# Patient Record
Sex: Male | Born: 1970 | Race: Black or African American | Hispanic: No | Marital: Single | Smoking: Never smoker
Health system: Southern US, Community
[De-identification: ages and names within clinical notes are randomized; demographics above are authoritative.]

---

## 2003-09-02 ENCOUNTER — Emergency Department (HOSPITAL_COMMUNITY): Admission: EM | Admit: 2003-09-02 | Discharge: 2003-09-03 | Payer: Self-pay | Admitting: Emergency Medicine

## 2010-07-01 ENCOUNTER — Encounter: Admission: RE | Admit: 2010-07-01 | Discharge: 2010-07-01 | Payer: Self-pay | Admitting: Internal Medicine

## 2011-10-25 IMAGING — CR DG CHEST 2V
2 series · 2 of 2 positions shown · non-contrast
Comparison: None.

CLINICAL DATA: Positive TB test.

CHEST - 2 VIEW

[view not recorded (1 of 2)]
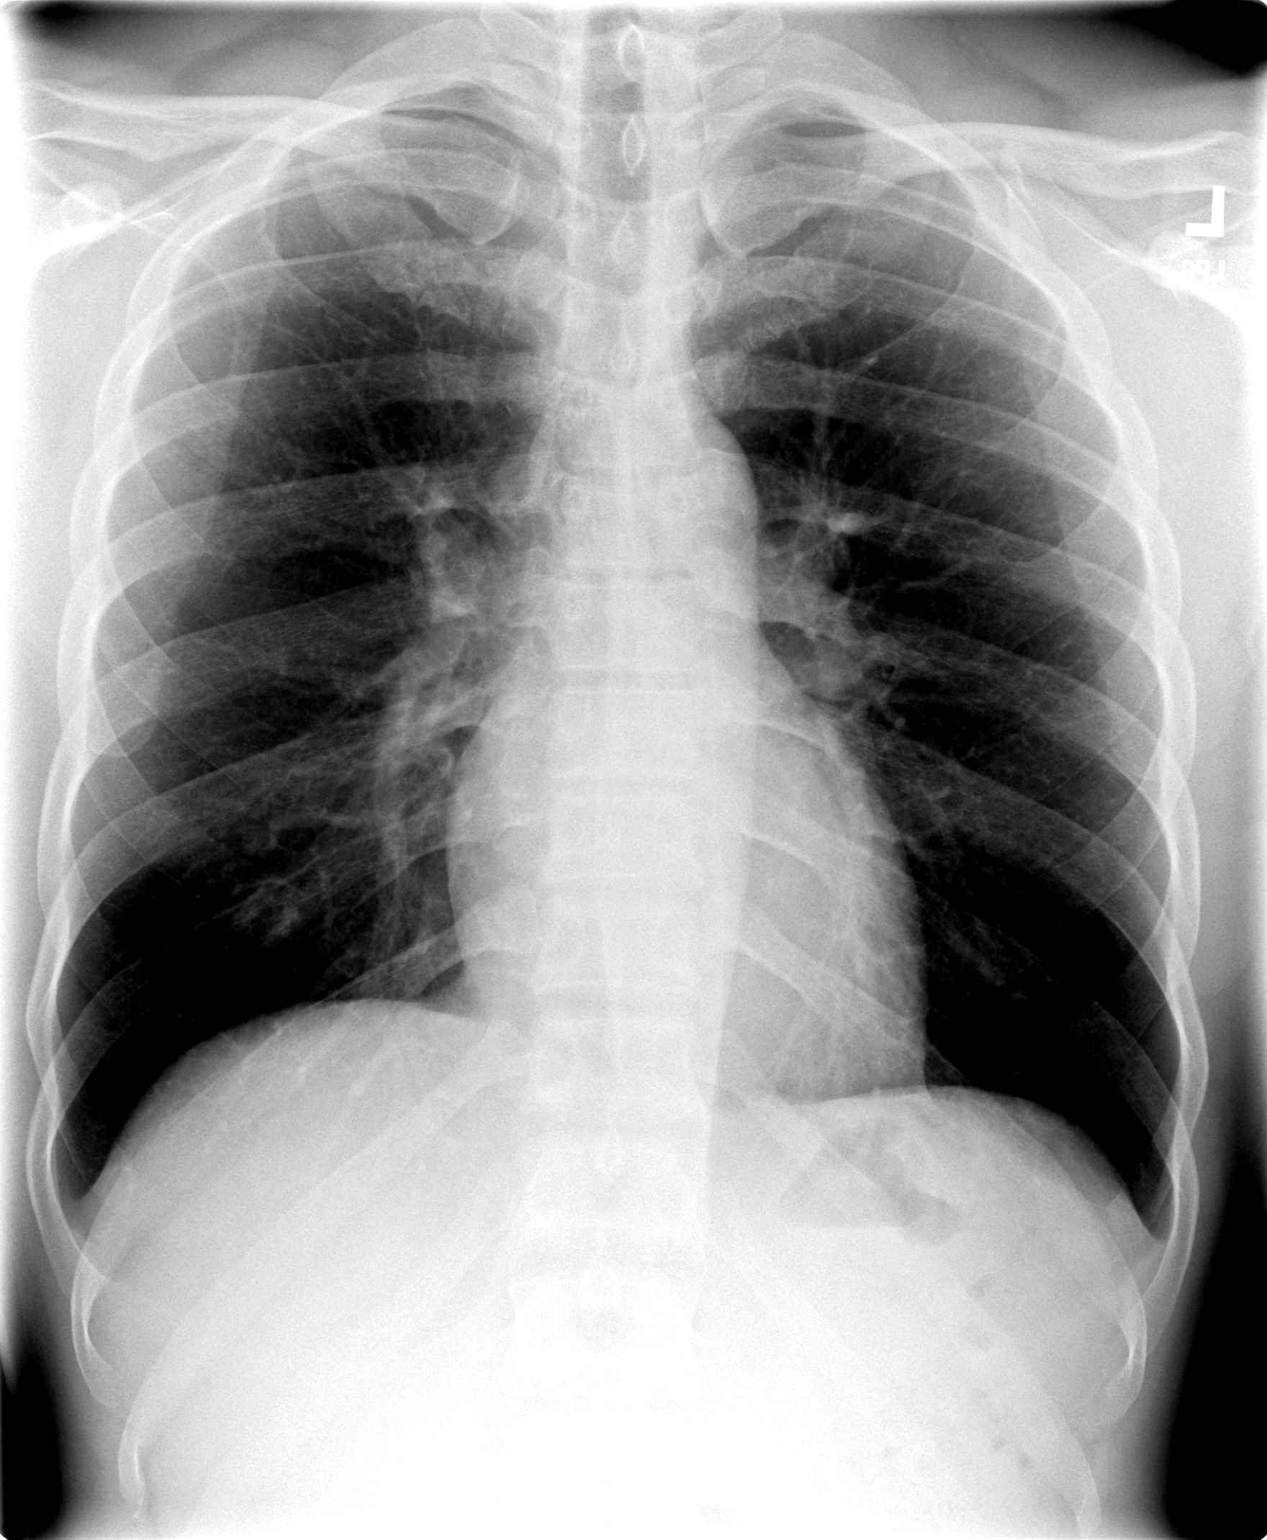

[view not recorded (2 of 2)]
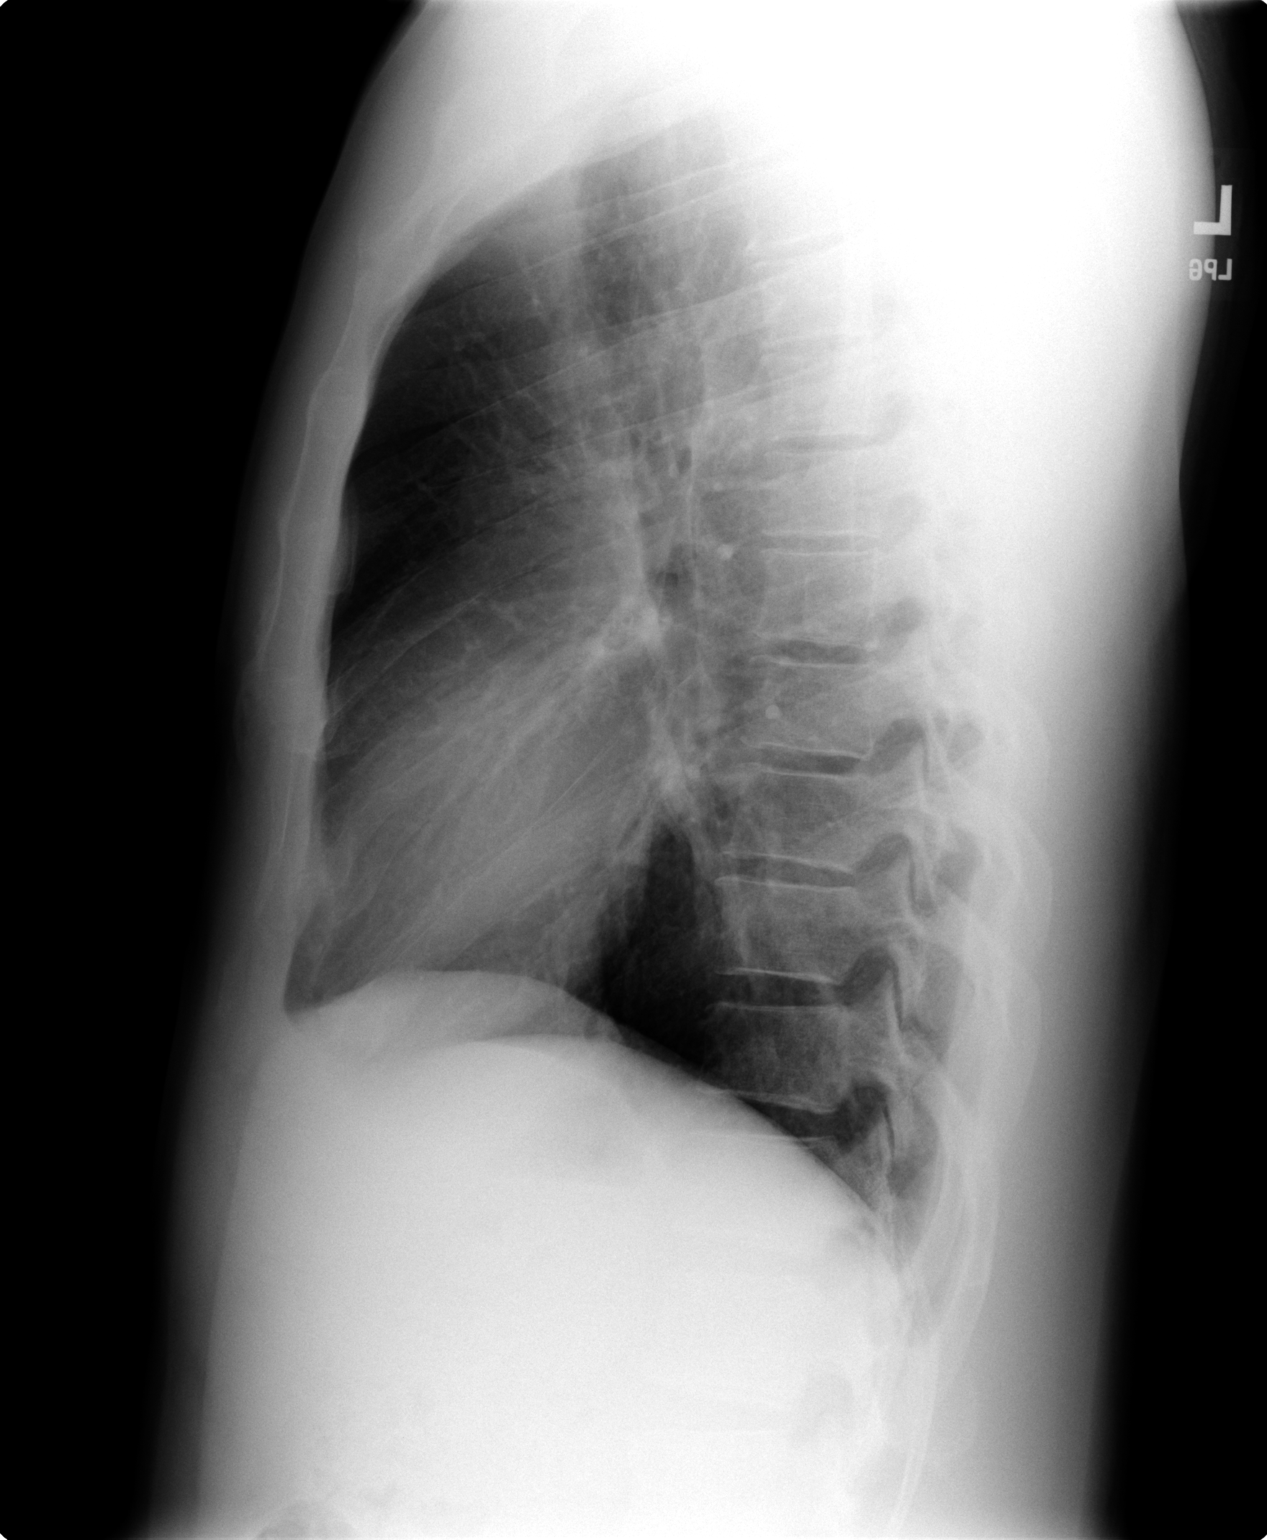

[2 of 2 positions shown; findings below may reference images not displayed]

FINDINGS: The heart and lungs are normal.  No effusions.  Osseous
structures are normal.
IMPRESSION: Normal exam.

## 2017-01-07 ENCOUNTER — Encounter (HOSPITAL_COMMUNITY): Payer: Self-pay | Admitting: *Deleted

## 2017-01-07 ENCOUNTER — Emergency Department (HOSPITAL_COMMUNITY)
Admission: EM | Admit: 2017-01-07 | Discharge: 2017-01-07 | Disposition: A | Discharge status: Home / Self Care | Payer: No Typology Code available for payment source | Attending: Emergency Medicine | Admitting: Emergency Medicine

## 2017-01-07 DIAGNOSIS — Y92410 Unspecified street and highway as the place of occurrence of the external cause: Secondary | ICD-10-CM | POA: Insufficient documentation

## 2017-01-07 DIAGNOSIS — M62830 Muscle spasm of back: Secondary | ICD-10-CM

## 2017-01-07 DIAGNOSIS — Y999 Unspecified external cause status: Secondary | ICD-10-CM | POA: Insufficient documentation

## 2017-01-07 DIAGNOSIS — M545 Low back pain, unspecified: Secondary | ICD-10-CM

## 2017-01-07 DIAGNOSIS — Y939 Activity, unspecified: Secondary | ICD-10-CM | POA: Insufficient documentation

## 2017-01-07 MED ORDER — NAPROXEN 500 MG PO TABS: 500.0000 mg | ORAL_TABLET | Freq: Two times a day (BID) | ORAL | 0 refills | Status: AC | PRN

## 2017-01-07 MED ORDER — CYCLOBENZAPRINE HCL 10 MG PO TABS: 10.0000 mg | ORAL_TABLET | Freq: Three times a day (TID) | ORAL | 0 refills | Status: AC | PRN

## 2017-01-07 NOTE — Discharge Instructions (Signed)
Take naprosyn as directed for inflammation and pain with tylenol for breakthrough pain and flexeril for muscle relaxation. Do not drive or operate machinery with muscle relaxant use. Ice to areas of soreness for the next 24 hours and then may move to heat, no more than 20 minutes at a time every hour for each. Expect to be sore for the next few days and follow up with primary care physician for recheck of ongoing symptoms in the next 1-2 weeks. Return to ER for emergent changing or worsening of symptoms.  °  °
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
## 2023-01-08 ENCOUNTER — Emergency Department (HOSPITAL_BASED_OUTPATIENT_CLINIC_OR_DEPARTMENT_OTHER)
Admission: EM | Admit: 2023-01-08 | Discharge: 2023-01-08 | Disposition: A | Discharge status: Home / Self Care | Payer: Self-pay | Attending: Emergency Medicine | Admitting: Emergency Medicine

## 2023-01-08 ENCOUNTER — Other Ambulatory Visit: Payer: Self-pay

## 2023-01-08 ENCOUNTER — Encounter (HOSPITAL_BASED_OUTPATIENT_CLINIC_OR_DEPARTMENT_OTHER): Payer: Self-pay

## 2023-01-08 DIAGNOSIS — X58XXXA Exposure to other specified factors, initial encounter: Secondary | ICD-10-CM | POA: Insufficient documentation

## 2023-01-08 DIAGNOSIS — S46811A Strain of other muscles, fascia and tendons at shoulder and upper arm level, right arm, initial encounter: Secondary | ICD-10-CM

## 2023-01-08 DIAGNOSIS — S46911A Strain of unspecified muscle, fascia and tendon at shoulder and upper arm level, right arm, initial encounter: Secondary | ICD-10-CM | POA: Insufficient documentation

## 2023-01-08 MED ORDER — ACETAMINOPHEN 500 MG PO TABS
1000.0000 mg | ORAL_TABLET | Freq: Once | ORAL | Status: AC
  Administered 2023-01-08: 1000 mg via ORAL
  Filled 2023-01-08: qty 2

## 2023-01-08 MED ORDER — OXYCODONE HCL 5 MG PO TABS
5.0000 mg | ORAL_TABLET | Freq: Once | ORAL | Status: AC
  Administered 2023-01-08: 5 mg via ORAL
  Filled 2023-01-08: qty 1

## 2023-01-08 MED ORDER — KETOROLAC TROMETHAMINE 15 MG/ML IJ SOLN
15.0000 mg | Freq: Once | INTRAMUSCULAR | Status: DC
  Filled 2023-01-08: qty 1

## 2023-01-08 MED ORDER — KETOROLAC TROMETHAMINE 15 MG/ML IJ SOLN
15.0000 mg | Freq: Once | INTRAMUSCULAR | Status: AC
  Administered 2023-01-08: 15 mg via INTRAMUSCULAR

## 2023-01-08 MED ORDER — DIAZEPAM 5 MG PO TABS
5.0000 mg | ORAL_TABLET | Freq: Once | ORAL | Status: AC
  Administered 2023-01-08: 5 mg via ORAL
  Filled 2023-01-08: qty 1

## 2023-01-08 NOTE — ED Triage Notes (Signed)
Patient here POV from Home.  Endorses Right Shoulder Pain that began 5 Days ago. Worsened since it began and causes a radiating Numbness to Right Hand.  No known Trauma. States he had a Similar Pain Last year but it subsided.   NAD Noted during Triage. A&Ox4. GCS 15. Ambulatory.

## 2023-01-08 NOTE — ED Notes (Signed)
Pt understood no drinking, driving or operating heavy equipment due to the meds that were given.

## 2023-01-08 NOTE — Discharge Instructions (Signed)
Take 4 over the counter ibuprofen tablets 3 times a day or 2 over-the-counter naproxen tablets twice a day for pain. Also take tylenol 1000mg (2 extra strength) four times a day.    Follow-up with your doctor in the office this.  This is something that might need physical therapy.

## 2023-01-08 NOTE — ED Notes (Signed)
Discharge paperwork given and verbally understood. 

## 2023-01-08 NOTE — ED Provider Notes (Signed)
[facility] Provider Note   CSN: [id number] Arrival date & time: 01/08/23  1123     History  Chief Complaint  Patient presents with   Shoulder Pain    Johnny Humphrey is a 52 y.o. male.  52 yo M with a chief complaints of right shoulder pain that radiates down the arm.  This has been going on for about a week.  He had something similar happen about a year ago that spontaneously improved.  He denies injury to his head or his neck.  Denies fevers.   Shoulder Pain      Home Medications Prior to Admission medications   Medication Sig Start Date End Date Taking? Authorizing Provider  cyclobenzaprine (FLEXERIL) 10 MG tablet Take 1 tablet (10 mg total) by mouth 3 (three) times daily as needed for muscle spasms. 01/07/17   Street, [place], PA-C  naproxen (NAPROSYN) 500 MG tablet Take 1 tablet (500 mg total) by mouth 2 (two) times daily as needed for mild pain, moderate pain or headache (TAKE WITH MEALS.). 01/07/17   Street, [place], PA-C      Allergies    Patient has no known allergies.    Review of Systems   Review of Systems  Physical Exam Updated Vital Signs BP 135/86 (BP Location: Right Arm)   Pulse 72   Temp 97.8 F (36.6 C)   Resp 16   Ht 5\' 10"  (1.778 m)   Wt 99.8 kg   SpO2 97%   BMI 31.57 kg/m  Physical Exam Vitals and nursing note reviewed.  Constitutional:      Appearance: He is well-developed.  HENT:     Head: Normocephalic and atraumatic.  Eyes:     Pupils: Pupils are equal, round, and reactive to light.  Neck:     Vascular: No JVD.  Cardiovascular:     Rate and Rhythm: Normal rate and regular rhythm.     Heart sounds: No murmur heard.    No friction rub. No gallop.  Pulmonary:     Effort: No respiratory distress.     Breath sounds: No wheezing.  Abdominal:     General: There is no distension.     Tenderness: There is no abdominal tenderness. There is no guarding or rebound.  Musculoskeletal:         General: Normal range of motion.     Cervical back: Normal range of motion and neck supple.     Comments: Pain and spasm to the right trapezius muscle belly.  Pulse motor and sensation intact distally.  Some mild weakness compared to the left with tricep extension, likely secondary to discomfort.  Skin:    Coloration: Skin is not pale.     Findings: No rash.  Neurological:     Mental Status: He is alert and oriented to person, place, and time.  Psychiatric:        Behavior: Behavior normal.     ED Results / Procedures / Treatments   Labs (all labs ordered are listed, but only abnormal results are displayed) Labs Reviewed - No data to display  EKG None  Radiology No results found.  Procedures Procedures    Medications Ordered in ED Medications  acetaminophen (TYLENOL) tablet 1,000 mg (1,000 mg Oral Given 01/08/23 1213)  oxyCODONE (Oxy IR/ROXICODONE) immediate release tablet 5 mg (5 mg Oral Given 01/08/23 1213)  diazepam (VALIUM) tablet 5 mg (5 mg Oral Given 01/08/23 1213)  ketorolac (TORADOL) 15 MG/ML injection 15 mg (  15 mg Intramuscular Given 01/08/23 1217)    ED Course/ Medical Decision Making/ A&P                             Medical Decision Making Risk OTC drugs. Prescription drug management.   52 yo M with a chief complaint of right shoulder pain that rates down the arm.  Patient has no obvious C-spine findings.  Negative Spurling's maneuver.  Has significant pain and spasm to the right trapezius.  Sling for comfort.  Sports medicine follow-up.  12:27 PM:  I have discussed the diagnosis/risks/treatment options with the patient and friend .  Evaluation and diagnostic testing in the emergency department does not suggest an emergent condition requiring admission or immediate intervention beyond what has been performed at this time.  They will follow up with Sports med, pcp. We also discussed returning to the ED immediately if new or worsening sx occur. We discussed the sx  which are most concerning (e.g., sudden worsening pain, fever, inability to tolerate by mouth) that necessitate immediate return. Medications administered to the patient during their visit and any new prescriptions provided to the patient are listed below.  Medications given during this visit Medications  acetaminophen (TYLENOL) tablet 1,000 mg (1,000 mg Oral Given 01/08/23 1213)  oxyCODONE (Oxy IR/ROXICODONE) immediate release tablet 5 mg (5 mg Oral Given 01/08/23 1213)  diazepam (VALIUM) tablet 5 mg (5 mg Oral Given 01/08/23 1213)  ketorolac (TORADOL) 15 MG/ML injection 15 mg (15 mg Intramuscular Given 01/08/23 1217)     The patient appears reasonably screen and/or stabilized for discharge and I doubt any other medical condition or other [facility] requiring further screening, evaluation, or treatment in the ED at this time prior to discharge.         Final Clinical Impression(s) / ED Diagnoses Final diagnoses:  Trapezius strain, right, initial encounter    Rx / DC Orders ED Discharge Orders     None         Deno Etienne, DO 01/08/23 1227
